# Patient Record
Sex: Female | Born: 1965 | Race: White | Hispanic: No | Smoking: Current every day smoker
Health system: Southern US, Community
[De-identification: ages and names within clinical notes are randomized; demographics above are authoritative.]

## PROBLEM LIST (undated history)

## (undated) DIAGNOSIS — G8929 Other chronic pain: Secondary | ICD-10-CM

## (undated) DIAGNOSIS — N319 Neuromuscular dysfunction of bladder, unspecified: Secondary | ICD-10-CM

## (undated) DIAGNOSIS — G8222 Paraplegia, incomplete: Secondary | ICD-10-CM

## (undated) DIAGNOSIS — S22081A Stable burst fracture of T11-T12 vertebra, initial encounter for closed fracture: Secondary | ICD-10-CM

## (undated) DIAGNOSIS — F329 Major depressive disorder, single episode, unspecified: Secondary | ICD-10-CM

## (undated) DIAGNOSIS — R51 Headache: Secondary | ICD-10-CM

## (undated) DIAGNOSIS — M549 Dorsalgia, unspecified: Secondary | ICD-10-CM

## (undated) DIAGNOSIS — F419 Anxiety disorder, unspecified: Secondary | ICD-10-CM

## (undated) DIAGNOSIS — R52 Pain, unspecified: Secondary | ICD-10-CM

## (undated) DIAGNOSIS — F32A Depression, unspecified: Secondary | ICD-10-CM

## (undated) DIAGNOSIS — F319 Bipolar disorder, unspecified: Secondary | ICD-10-CM

## (undated) DIAGNOSIS — M461 Sacroiliitis, not elsewhere classified: Secondary | ICD-10-CM

## (undated) HISTORY — DX: Headache: R51

## (undated) HISTORY — PX: CERVICAL FUSION: SHX112

## (undated) HISTORY — PX: OTHER SURGICAL HISTORY: SHX169

## (undated) HISTORY — PX: BACK SURGERY: SHX140

---

## 2007-11-18 ENCOUNTER — Inpatient Hospital Stay (HOSPITAL_COMMUNITY): Admission: RE | Admit: 2007-11-18 | Discharge: 2007-11-19 | Payer: Self-pay | Admitting: Neurosurgery

## 2008-04-06 ENCOUNTER — Encounter: Admission: RE | Admit: 2008-04-06 | Discharge: 2008-04-06 | Payer: Self-pay | Admitting: Neurosurgery

## 2008-09-07 ENCOUNTER — Encounter
Admission: RE | Admit: 2008-09-07 | Discharge: 2008-09-07 | Payer: Self-pay | Admitting: Physical Medicine & Rehabilitation

## 2009-05-18 ENCOUNTER — Emergency Department (HOSPITAL_COMMUNITY): Admission: EM | Admit: 2009-05-18 | Discharge: 2009-05-18 | Payer: Self-pay | Admitting: Emergency Medicine

## 2009-11-29 ENCOUNTER — Encounter: Admission: RE | Admit: 2009-11-29 | Discharge: 2009-11-29 | Payer: Self-pay | Admitting: Neurosurgery

## 2010-07-03 ENCOUNTER — Encounter
Admission: RE | Admit: 2010-07-03 | Discharge: 2010-07-03 | Payer: Self-pay | Source: Home / Self Care | Attending: Physical Medicine and Rehabilitation | Admitting: Physical Medicine and Rehabilitation
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
## 2011-03-08 LAB — CBC
HCT: 39.6
Hemoglobin: 13.5
MCHC: 34.1
RDW: 13.1
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
## 2011-07-13 DIAGNOSIS — R079 Chest pain, unspecified: Secondary | ICD-10-CM

## 2012-09-15 ENCOUNTER — Other Ambulatory Visit: Payer: Self-pay | Admitting: *Deleted

## 2012-09-15 DIAGNOSIS — G4733 Obstructive sleep apnea (adult) (pediatric): Secondary | ICD-10-CM

## 2012-09-17 ENCOUNTER — Ambulatory Visit (INDEPENDENT_AMBULATORY_CARE_PROVIDER_SITE_OTHER): Payer: Medicaid Other | Admitting: Neurology

## 2012-09-17 VITALS — BP 109/53 | HR 65 | Ht 67.0 in | Wt 220.0 lb

## 2012-09-17 DIAGNOSIS — G4733 Obstructive sleep apnea (adult) (pediatric): Secondary | ICD-10-CM

## 2012-09-17 DIAGNOSIS — G4710 Hypersomnia, unspecified: Secondary | ICD-10-CM

## 2016-07-25 ENCOUNTER — Encounter (HOSPITAL_BASED_OUTPATIENT_CLINIC_OR_DEPARTMENT_OTHER): Payer: Medicaid Other | Attending: Surgery

## 2016-09-11 ENCOUNTER — Other Ambulatory Visit (HOSPITAL_COMMUNITY): Payer: Self-pay | Admitting: Internal Medicine

## 2016-09-11 DIAGNOSIS — I70229 Atherosclerosis of native arteries of extremities with rest pain, unspecified extremity: Secondary | ICD-10-CM

## 2016-09-13 ENCOUNTER — Ambulatory Visit (HOSPITAL_COMMUNITY)
Admission: RE | Admit: 2016-09-13 | Discharge: 2016-09-13 | Disposition: A | Discharge status: Home / Self Care | Payer: Medicaid Other | Source: Ambulatory Visit | Attending: Internal Medicine | Admitting: Internal Medicine

## 2016-09-13 DIAGNOSIS — I70229 Atherosclerosis of native arteries of extremities with rest pain, unspecified extremity: Secondary | ICD-10-CM | POA: Diagnosis present

## 2016-11-13 ENCOUNTER — Emergency Department (HOSPITAL_COMMUNITY)
Admission: EM | Admit: 2016-11-13 | Discharge: 2016-11-13 | Disposition: A | Discharge status: Home / Self Care | Payer: Medicaid Other | Attending: Emergency Medicine | Admitting: Emergency Medicine

## 2016-11-13 ENCOUNTER — Emergency Department (HOSPITAL_COMMUNITY): Payer: Medicaid Other

## 2016-11-13 ENCOUNTER — Encounter (HOSPITAL_COMMUNITY): Payer: Self-pay

## 2016-11-13 DIAGNOSIS — W010XXA Fall on same level from slipping, tripping and stumbling without subsequent striking against object, initial encounter: Secondary | ICD-10-CM | POA: Insufficient documentation

## 2016-11-13 DIAGNOSIS — F17210 Nicotine dependence, cigarettes, uncomplicated: Secondary | ICD-10-CM | POA: Diagnosis not present

## 2016-11-13 DIAGNOSIS — M545 Low back pain, unspecified: Secondary | ICD-10-CM

## 2016-11-13 DIAGNOSIS — G8929 Other chronic pain: Secondary | ICD-10-CM | POA: Diagnosis not present

## 2016-11-13 DIAGNOSIS — Y999 Unspecified external cause status: Secondary | ICD-10-CM | POA: Insufficient documentation

## 2016-11-13 DIAGNOSIS — M25512 Pain in left shoulder: Secondary | ICD-10-CM | POA: Diagnosis not present

## 2016-11-13 DIAGNOSIS — S0012XA Contusion of left eyelid and periocular area, initial encounter: Secondary | ICD-10-CM | POA: Insufficient documentation

## 2016-11-13 DIAGNOSIS — W19XXXA Unspecified fall, initial encounter: Secondary | ICD-10-CM

## 2016-11-13 DIAGNOSIS — Y939 Activity, unspecified: Secondary | ICD-10-CM | POA: Diagnosis not present

## 2016-11-13 DIAGNOSIS — S0990XA Unspecified injury of head, initial encounter: Secondary | ICD-10-CM | POA: Diagnosis present

## 2016-11-13 DIAGNOSIS — Y92009 Unspecified place in unspecified non-institutional (private) residence as the place of occurrence of the external cause: Secondary | ICD-10-CM | POA: Diagnosis not present

## 2016-11-13 DIAGNOSIS — S0512XA Contusion of eyeball and orbital tissues, left eye, initial encounter: Secondary | ICD-10-CM

## 2016-11-13 HISTORY — DX: Stable burst fracture of t11-T12 vertebra, initial encounter for closed fracture: S22.081A

## 2016-11-13 HISTORY — DX: Sacroiliitis, not elsewhere classified: M46.1

## 2016-11-13 HISTORY — DX: Anxiety disorder, unspecified: F41.9

## 2016-11-13 HISTORY — DX: Dorsalgia, unspecified: M54.9

## 2016-11-13 HISTORY — DX: Depression, unspecified: F32.A

## 2016-11-13 HISTORY — DX: Major depressive disorder, single episode, unspecified: F32.9

## 2016-11-13 HISTORY — DX: Bipolar disorder, unspecified: F31.9

## 2016-11-13 HISTORY — DX: Pain, unspecified: R52

## 2016-11-13 HISTORY — DX: Paraplegia, incomplete: G82.22

## 2016-11-13 HISTORY — DX: Other chronic pain: G89.29

## 2016-11-13 HISTORY — DX: Neuromuscular dysfunction of bladder, unspecified: N31.9

## 2016-11-13 MED ORDER — OXYCODONE-ACETAMINOPHEN 5-325 MG PO TABS
1.0000 | ORAL_TABLET | Freq: Once | ORAL | Status: AC
  Administered 2016-11-13: 1 via ORAL
  Filled 2016-11-13: qty 1

## 2016-11-13 MED ORDER — METHOCARBAMOL 500 MG PO TABS: 1000.0000 mg | ORAL_TABLET | Freq: Four times a day (QID) | ORAL | 0 refills | Status: AC | PRN

## 2016-11-13 NOTE — ED Provider Notes (Signed)
[facility] DEPT Provider Note   CSN: [id number] Arrival date & time: 11/13/16  1113     History   Chief Complaint Chief Complaint  Patient presents with  . Fall  . Shoulder Pain    HPI Jennifer CoddingtonWendy J Montufar is a 51 y.o. female.  HPI  Pt was seen at 1210. Per pt, c/o sudden onset and resolution of one episode of fall that occurred 3 days ago. Pt states she slipped and fell while transferring into the shower. States she landed on her left side. Pt c/o head injury, left shoulder pain, low back pain. Denies LOC, no AMS, no neck pain, no new focal motor weakness from baseline, no CP/SOB, no abd pain, no N/V/D.   Past Medical History:  Diagnosis Date  . Anxiety   . Back pain   . Bilateral sacroiliitis (HCC)   . Bipolar disorder (HCC)   . Chronic pain   . Depression   . Headache(784.0)   . Incomplete paraplegia (HCC)   . Neurogenic bladder   . Pain management   . T12 burst fracture (HCC)     There are no active problems to display for this patient.   Past Surgical History:  Procedure Laterality Date  . ankle sx     plates and screws in left ankle  . BACK SURGERY     broken back, rods in back  . CERVICAL FUSION      OB History    No data available       Home Medications    Prior to Admission medications   Medication Sig Start Date End Date Taking? Authorizing Provider  ALPRAZolam Prudy Feeler(XANAX) 1 MG tablet Take 1 mg by mouth every 4 (four) hours as needed. 09/06/15  Yes [provider]  buPROPion (WELLBUTRIN XL) 300 MG 24 hr tablet Take 1 tablet by mouth daily. 06/25/16  Yes [provider]  escitalopram (LEXAPRO) 20 MG tablet Take 20 mg by mouth at bedtime.   Yes [provider]  gabapentin (NEURONTIN) 300 MG capsule Take 1,200 mg by mouth 3 (three) times daily. 05/29/16 05/29/17 Yes [provider]  lithium carbonate (ESKALITH) 450 MG CR tablet Take 900 mg by mouth at bedtime. 06/25/16  Yes [provider]  mirabegron ER  (MYRBETRIQ) 25 MG TB24 tablet Take 25 mg by mouth daily. 08/28/16  Yes [provider]  oxyCODONE (OXY IR/ROXICODONE) 5 MG immediate release tablet Take 5 mg by mouth every 4 (four) hours as needed. 10/30/16  Yes [provider]  traZODone (DESYREL) 50 MG tablet Take 50 mg by mouth at bedtime.   Yes [provider]    Family History No family history on file.  Social History Social History  Substance Use Topics  . Smoking status: Current Every Day Smoker    Packs/day: 1.00    Types: Cigarettes  . Smokeless tobacco: Never Used  . Alcohol use No     Allergies   Depakote [divalproex sodium] and Tegretol [carbamazepine]   Review of Systems Review of Systems ROS: Statement: All systems negative except as marked or noted in the HPI; Constitutional: Negative for fever and chills. ; ; Eyes: Negative for eye pain, redness and discharge. ; ; ENMT: Negative for ear pain, hoarseness, nasal congestion, sinus pressure and sore throat. ; ; Cardiovascular: Negative for chest pain, palpitations, diaphoresis, dyspnea and peripheral edema. ; ; Respiratory: Negative for cough, wheezing and stridor. ; ; Gastrointestinal: Negative for nausea, vomiting, diarrhea, abdominal pain, blood in stool, hematemesis,  jaundice and rectal bleeding. . ; ; Genitourinary: Negative for dysuria, flank pain and hematuria. ; ; Musculoskeletal: +LBP, left shoulder pain, head injury. Negative for neck pain. Negative for swelling and deformity.; ; Skin: Negative for pruritus, rash, abrasions, blisters, bruising and skin lesion.; ; Neuro: Negative for headache, lightheadedness and neck stiffness. Negative for weakness, altered level of consciousness, altered mental status, extremity weakness, paresthesias, involuntary movement, seizure and syncope.       Physical Exam Updated Vital Signs BP 130/84 (BP Location: Right Arm)   Pulse 75   Temp 98 F (36.7 C) (Oral)   Resp 20   Ht 5\' 9"  (1.753 m)   Wt  104.3 kg (230 lb)   SpO2 95%   BMI 33.97 kg/m   Physical Exam 1215: Physical examination: Vital signs and O2 SAT: Reviewed; Constitutional: Well developed, Well nourished, Well hydrated, In no acute distress; Head and Face: Normocephalic, No scalp hematomas, no lacs.  Non-tender to palp superior and inferior orbital rim areas.  No zygoma tenderness.  No mandibular tenderness.; Eyes: EOMI, PERRL, No scleral icterus. +left fading periorbital ecchymosis. No obvious hyphema or hypopyon.; ENMT: Mouth and pharynx normal, Left TM normal, Right TM normal, Mucous membranes moist, +teeth and tongue intact.  No intraoral or intranasal bleeding.  No septal hematomas.  No trismus, no malocclusion.;  Neck: Supple, Trachea midline; Spine: +TTP R>L lumbar paraspinal muscles. No ecchymosis, no abrasions. No midline CS, TS, LS tenderness.; Cardiovascular: Regular rate and rhythm, No gallop; Respiratory: Breath sounds clear & equal bilaterally, No rales, rhonchi, wheezes, Normal respiratory effort/excursion; Chest: Nontender, No deformity, Movement normal, No crepitus, No abrasions or ecchymosis.; Abdomen: Soft, Nontender, Nondistended, Normal bowel sounds, No abrasions or ecchymosis.; Genitourinary: No CVA tenderness;; Extremities: Left shoulder w/FROM.  +TTP entire joint and left clavicle. Scapula NT, proximal humerus NT, biceps tendon NT over bicipital groove.  Motor strength at shoulder normal.  Sensation intact over deltoid region, distal NMS intact with left hand having intact and equal sensation and strength in the distribution of the median, radial, and ulnar nerve function compared to opposite side.  Strong radial pulse.  +FROM left elbow with intact motor strength biceps and triceps muscles to resistance.  No ecchymosis, no abrasions, no deformity, no edema. Full range of motion major/large joints of bilat UE's and LE's without pain or tenderness to palp, Neurovascularly intact, Pulses normal, No deformity. No edema,  Pelvis stable; Neuro: AA&Ox3.  Major CN grossly intact. Speech clear. Bilat LE's weakness per hx, otherwise no new gross focal motor deficits in extremities.; Skin: Color normal, Warm, Dry   ED Treatments / Results  Labs (all labs ordered are listed, but only abnormal results are displayed)   EKG  EKG Interpretation None       Radiology   Procedures Procedures (including critical care time)  Medications Ordered in ED Medications  oxyCODONE-acetaminophen (PERCOCET/ROXICET) 5-325 MG per tablet 1 tablet (not administered)     Initial Impression / Assessment and Plan / ED Course  I have reviewed the triage vital signs and the nursing notes.  Pertinent labs & imaging results that were available during my care of the patient were reviewed by me and considered in my medical decision making (see chart for details).  MDM Reviewed: previous chart, nursing note and vitals Interpretation: x-ray and CT scan   Dg Chest 2 View Result Date: 11/13/2016 CLINICAL DATA:  Status post fall. EXAM: CHEST  2 VIEW COMPARISON:  None. FINDINGS: There is no focal parenchymal opacity. There  is no pleural effusion or pneumothorax. The heart and mediastinal contours are unremarkable. Small focal sclerotic lesion in the right proximal humerus likely reflecting a benign chondroid lesion such as an enchondroma. Lower anterior cervical fusion. Partially visualize posterior thoracolumbar fusion. IMPRESSION: No active cardiopulmonary disease. Electronically Signed   By: Elige Ko   On: 11/13/2016 13:38   Dg Lumbar Spine Complete Result Date: 11/13/2016 CLINICAL DATA:  Fall, back pain EXAM: LUMBAR SPINE - COMPLETE 4+ VIEW COMPARISON:  CT thoracolumbar spine dated 03/20/2015 FINDINGS: Thoracolumbar spine fixation hardware extending from T9 through L2, without evidence of hardware complication. Moderate compression fracture deformity at T12, unchanged from prior CT. No evidence of acute fracture or dislocation.  Vertebral body heights are otherwise maintained. Mild degenerative changes. Visualized bony pelvis appears intact. IMPRESSION: Moderate compression fracture deformity at T12, unchanged. Associated T9-L2 thoracolumbar fixation, without evidence of complication. No evidence of acute fracture or dislocation. Mild degenerative changes. Electronically Signed   By: Charline Bills M.D.   On: 11/13/2016 13:39   Dg Clavicle Left Result Date: 11/13/2016 CLINICAL DATA:  Fall. EXAM: LEFT CLAVICLE - 2+ VIEWS COMPARISON:  11/13/2016. FINDINGS: Acromioclavicular glenohumeral degenerative change. No evidence of fracture dislocation. Cervical spine fusion. Thoracolumbar spine fusion. Carotid vascular calcification cannot be excluded. IMPRESSION: 1. Acromioclavicular and glenohumeral degenerative change. No acute bony abnormality identified. Clavicle is intact. No evidence of fracture. 2. Prior cervicothoracic and thoracolumbar spine fusion. 3. Carotid vascular disease cannot be excluded. Electronically Signed   By: Maisie Fus  Register   On: 11/13/2016 13:40   Ct Head Wo Contrast Result Date: 11/13/2016 CLINICAL DATA:  Pain following fall EXAM: CT HEAD WITHOUT CONTRAST CT MAXILLOFACIAL WITHOUT CONTRAST CT CERVICAL SPINE WITHOUT CONTRAST TECHNIQUE: Multidetector CT imaging of the head, cervical spine, and maxillofacial structures were performed using the standard protocol without intravenous contrast. Multiplanar CT image reconstructions of the cervical spine and maxillofacial structures were also generated. COMPARISON:  Postmyelogram cervical CT November 29, 2009 FINDINGS: CT HEAD FINDINGS Brain: The ventricles are normal in size and configuration. There is mild frontal atrophy bilaterally. There is no intracranial mass, hemorrhage, extra-axial fluid collection, or midline shift. Gray-white compartments are normal. No acute infarct evident. Vascular: No hyperdense vessel. There is slight calcification in the left carotid siphon.  Skull: Bony calvarium appears intact. Other: Mastoid air cells are clear. CT MAXILLOFACIAL FINDINGS Osseous: There is no demonstrable fracture or dislocation. No blastic or lytic bone lesions. Orbits: There is preseptal soft tissue swelling over the right orbit. There is no intraorbital lesion on either side. Intraorbital contents appear symmetric bilaterally. Sinuses: There is mucosal thickening in each superior maxillary antrum as well as along the inferior aspect of the right maxillary antrum. There is mucosal thickening in several ethmoid air cells bilaterally. Other paranasal sinuses are clear. Ostiomeatal unit complexes are patent bilaterally. There is no nares obstruction. There is a concha bullosa on the right, an anatomic variant. There is mild leftward deviation of the nasal septum. Soft tissues: There is mild soft tissue swelling over each mid upper face. No evidence of abscess. There is fatty infiltration of both parotid glands. Other salivary glands appear normal. No adenopathy. Tongue and tongue base regions appear normal. Visualized pharynx appears unremarkable. CT CERVICAL SPINE FINDINGS Alignment: There is no spondylolisthesis. Skull base and vertebrae: Skull base and craniocervical junction regions appear normal. The patient is status post anterior screw and plate fixation at C6 and C7. Support hardware appears intact. There is no evident fracture. No blastic or lytic  bone lesions. Soft tissues and spinal canal: Prevertebral soft tissues and predental space regions are normal. There are no paraspinous lesions. No cord or canal hematoma evident. Disc levels: There is fusion at C6-7. There is moderate disc space narrowing at C4-5 and C5-6. There is facet hypertrophy at several levels bilaterally. No disc extrusion or stenosis evident. Upper chest: Visualized upper lung zones are clear. Other: There is calcification in the left carotid artery. IMPRESSION: CT head: Frontal atrophy bilaterally.  Ventricles normal in size and configuration. No mass, hemorrhage, or extra-axial fluid collection. Gray-white compartments appear normal. Minimal left distal carotid artery calcification. CT maxillofacial: Areas of soft tissue swelling in the mid and upper face regions with preseptal soft tissue swelling over the right eye. No fracture or dislocation. Areas of mucosal thickening in several paranasal sinuses. No air-fluid level. No bony destruction or expansion. Ostiomeatal unit complexes are patent bilaterally. There is slight leftward deviation of the nasal septum. There is fatty infiltration of both parotid glands. Clinical significance of this finding uncertain. CT cervical spine: No fracture or spondylolisthesis. Postoperative change at C6 and C7. Osteoarthritic change noted at several levels. There is calcification in the left carotid artery. Electronically Signed   By: Bretta Bang III M.D.   On: 11/13/2016 13:20   Ct Cervical Spine Wo Contrast Result Date: 11/13/2016 CLINICAL DATA:  Pain following fall EXAM: CT HEAD WITHOUT CONTRAST CT MAXILLOFACIAL WITHOUT CONTRAST CT CERVICAL SPINE WITHOUT CONTRAST TECHNIQUE: Multidetector CT imaging of the head, cervical spine, and maxillofacial structures were performed using the standard protocol without intravenous contrast. Multiplanar CT image reconstructions of the cervical spine and maxillofacial structures were also generated. COMPARISON:  Postmyelogram cervical CT November 29, 2009 FINDINGS: CT HEAD FINDINGS Brain: The ventricles are normal in size and configuration. There is mild frontal atrophy bilaterally. There is no intracranial mass, hemorrhage, extra-axial fluid collection, or midline shift. Gray-white compartments are normal. No acute infarct evident. Vascular: No hyperdense vessel. There is slight calcification in the left carotid siphon. Skull: Bony calvarium appears intact. Other: Mastoid air cells are clear. CT MAXILLOFACIAL FINDINGS Osseous: There  is no demonstrable fracture or dislocation. No blastic or lytic bone lesions. Orbits: There is preseptal soft tissue swelling over the right orbit. There is no intraorbital lesion on either side. Intraorbital contents appear symmetric bilaterally. Sinuses: There is mucosal thickening in each superior maxillary antrum as well as along the inferior aspect of the right maxillary antrum. There is mucosal thickening in several ethmoid air cells bilaterally. Other paranasal sinuses are clear. Ostiomeatal unit complexes are patent bilaterally. There is no nares obstruction. There is a concha bullosa on the right, an anatomic variant. There is mild leftward deviation of the nasal septum. Soft tissues: There is mild soft tissue swelling over each mid upper face. No evidence of abscess. There is fatty infiltration of both parotid glands. Other salivary glands appear normal. No adenopathy. Tongue and tongue base regions appear normal. Visualized pharynx appears unremarkable. CT CERVICAL SPINE FINDINGS Alignment: There is no spondylolisthesis. Skull base and vertebrae: Skull base and craniocervical junction regions appear normal. The patient is status post anterior screw and plate fixation at C6 and C7. Support hardware appears intact. There is no evident fracture. No blastic or lytic bone lesions. Soft tissues and spinal canal: Prevertebral soft tissues and predental space regions are normal. There are no paraspinous lesions. No cord or canal hematoma evident. Disc levels: There is fusion at C6-7. There is moderate disc space narrowing at C4-5 and C5-6.  There is facet hypertrophy at several levels bilaterally. No disc extrusion or stenosis evident. Upper chest: Visualized upper lung zones are clear. Other: There is calcification in the left carotid artery. IMPRESSION: CT head: Frontal atrophy bilaterally. Ventricles normal in size and configuration. No mass, hemorrhage, or extra-axial fluid collection. Gray-white compartments  appear normal. Minimal left distal carotid artery calcification. CT maxillofacial: Areas of soft tissue swelling in the mid and upper face regions with preseptal soft tissue swelling over the right eye. No fracture or dislocation. Areas of mucosal thickening in several paranasal sinuses. No air-fluid level. No bony destruction or expansion. Ostiomeatal unit complexes are patent bilaterally. There is slight leftward deviation of the nasal septum. There is fatty infiltration of both parotid glands. Clinical significance of this finding uncertain. CT cervical spine: No fracture or spondylolisthesis. Postoperative change at C6 and C7. Osteoarthritic change noted at several levels. There is calcification in the left carotid artery. Electronically Signed   By: Bretta Bang III M.D.   On: 11/13/2016 13:20   Dg Shoulder Left Result Date: 11/13/2016 CLINICAL DATA:  Status post fall.  Left shoulder pain. EXAM: LEFT SHOULDER - 2+ VIEW COMPARISON:  None. FINDINGS: There is no fracture or dislocation. There are mild degenerative changes of the acromioclavicular joint. IMPRESSION: No acute osseous injury of the left shoulder. Electronically Signed   By: Elige Ko   On: 11/13/2016 13:37   Ct Maxillofacial Wo Cm Result Date: 11/13/2016 CLINICAL DATA:  Pain following fall EXAM: CT HEAD WITHOUT CONTRAST CT MAXILLOFACIAL WITHOUT CONTRAST CT CERVICAL SPINE WITHOUT CONTRAST TECHNIQUE: Multidetector CT imaging of the head, cervical spine, and maxillofacial structures were performed using the standard protocol without intravenous contrast. Multiplanar CT image reconstructions of the cervical spine and maxillofacial structures were also generated. COMPARISON:  Postmyelogram cervical CT November 29, 2009 FINDINGS: CT HEAD FINDINGS Brain: The ventricles are normal in size and configuration. There is mild frontal atrophy bilaterally. There is no intracranial mass, hemorrhage, extra-axial fluid collection, or midline shift. Gray-white  compartments are normal. No acute infarct evident. Vascular: No hyperdense vessel. There is slight calcification in the left carotid siphon. Skull: Bony calvarium appears intact. Other: Mastoid air cells are clear. CT MAXILLOFACIAL FINDINGS Osseous: There is no demonstrable fracture or dislocation. No blastic or lytic bone lesions. Orbits: There is preseptal soft tissue swelling over the right orbit. There is no intraorbital lesion on either side. Intraorbital contents appear symmetric bilaterally. Sinuses: There is mucosal thickening in each superior maxillary antrum as well as along the inferior aspect of the right maxillary antrum. There is mucosal thickening in several ethmoid air cells bilaterally. Other paranasal sinuses are clear. Ostiomeatal unit complexes are patent bilaterally. There is no nares obstruction. There is a concha bullosa on the right, an anatomic variant. There is mild leftward deviation of the nasal septum. Soft tissues: There is mild soft tissue swelling over each mid upper face. No evidence of abscess. There is fatty infiltration of both parotid glands. Other salivary glands appear normal. No adenopathy. Tongue and tongue base regions appear normal. Visualized pharynx appears unremarkable. CT CERVICAL SPINE FINDINGS Alignment: There is no spondylolisthesis. Skull base and vertebrae: Skull base and craniocervical junction regions appear normal. The patient is status post anterior screw and plate fixation at C6 and C7. Support hardware appears intact. There is no evident fracture. No blastic or lytic bone lesions. Soft tissues and spinal canal: Prevertebral soft tissues and predental space regions are normal. There are no paraspinous lesions. No cord or  canal hematoma evident. Disc levels: There is fusion at C6-7. There is moderate disc space narrowing at C4-5 and C5-6. There is facet hypertrophy at several levels bilaterally. No disc extrusion or stenosis evident. Upper chest: Visualized  upper lung zones are clear. Other: There is calcification in the left carotid artery. IMPRESSION: CT head: Frontal atrophy bilaterally. Ventricles normal in size and configuration. No mass, hemorrhage, or extra-axial fluid collection. Gray-white compartments appear normal. Minimal left distal carotid artery calcification. CT maxillofacial: Areas of soft tissue swelling in the mid and upper face regions with preseptal soft tissue swelling over the right eye. No fracture or dislocation. Areas of mucosal thickening in several paranasal sinuses. No air-fluid level. No bony destruction or expansion. Ostiomeatal unit complexes are patent bilaterally. There is slight leftward deviation of the nasal septum. There is fatty infiltration of both parotid glands. Clinical significance of this finding uncertain. CT cervical spine: No fracture or spondylolisthesis. Postoperative change at C6 and C7. Osteoarthritic change noted at several levels. There is calcification in the left carotid artery. Electronically Signed   By: Bretta Bang III M.D.   On: 11/13/2016 13:20    1515:  XR/CT reassuring. Pt has tol PO well while in the ED without N/V. Pt has been typing on her cellphone and talking with her friend at bedside during her ED visit. NAD, resps easy. Tx symptomatically. [facility] P[place] accessed: pt filled oxycodone 5mg  tab, #120, on 10/30/16. Will not rx more narcotics. Dx and testing d/w pt and family.  Questions answered.  Verb understanding, agreeable to d/c home with outpt f/u.    Final Clinical Impressions(s) / ED Diagnoses   Final diagnoses:  None    New Prescriptions New Prescriptions   No medications on file     Samuel Jester, DO 11/16/16 1438

## 2016-11-13 NOTE — ED Notes (Signed)
Patient transported to X-ray 

## 2016-11-13 NOTE — ED Triage Notes (Signed)
Pt reports that she was transferring from her wheelchair to her shower bench, bench slid and she fell forward landing on left shoulder. Pt reports pain is also radiating from back to right side. Pt noted to have left black eye

## 2016-11-13 NOTE — ED Notes (Signed)
Patient back from  X-ray 

## 2016-11-13 NOTE — Discharge Instructions (Signed)
Take the prescription as directed.  Apply moist heat or ice to the area(s) of discomfort, for 15 minutes at a time, several times per day for the next few days.  Do not fall asleep on a heating or ice pack.  Call your regular medical doctor today to schedule a follow up appointment in the next 2 days.  Return to the Emergency Department immediately if worsening.
